# Patient Record
Sex: Female | Born: 1975 | Race: Black or African American | Hispanic: No | Marital: Married | Smoking: Never smoker
Health system: Southern US, Community
[De-identification: ages and names within clinical notes are randomized; demographics above are authoritative.]

## PROBLEM LIST (undated history)

## (undated) DIAGNOSIS — Z9889 Other specified postprocedural states: Secondary | ICD-10-CM

## (undated) DIAGNOSIS — F32A Depression, unspecified: Secondary | ICD-10-CM

## (undated) DIAGNOSIS — R112 Nausea with vomiting, unspecified: Secondary | ICD-10-CM

## (undated) DIAGNOSIS — F329 Major depressive disorder, single episode, unspecified: Secondary | ICD-10-CM

## (undated) HISTORY — PX: LEEP: SHX91

## (undated) SURGERY — Surgical Case
Anesthesia: *Unknown

---

## 2009-05-15 ENCOUNTER — Inpatient Hospital Stay (HOSPITAL_COMMUNITY): Admission: AD | Admit: 2009-05-15 | Discharge: 2009-05-19 | Payer: Self-pay | Admitting: Obstetrics and Gynecology

## 2009-05-16 ENCOUNTER — Encounter (INDEPENDENT_AMBULATORY_CARE_PROVIDER_SITE_OTHER): Payer: Self-pay | Admitting: Obstetrics and Gynecology

## 2009-06-12 ENCOUNTER — Ambulatory Visit: Admission: RE | Admit: 2009-06-12 | Discharge: 2009-06-12 | Payer: Self-pay | Admitting: Obstetrics and Gynecology

## 2010-06-28 LAB — CBC
HCT: 37.5 % (ref 36.0–46.0)
MCHC: 33.4 g/dL (ref 30.0–36.0)
MCHC: 33.9 g/dL (ref 30.0–36.0)
MCV: 99.2 fL (ref 78.0–100.0)
Platelets: 159 10*3/uL (ref 150–400)
Platelets: 189 10*3/uL (ref 150–400)
RDW: 13.5 % (ref 11.5–15.5)
RDW: 13.7 % (ref 11.5–15.5)

## 2010-12-24 LAB — HIV ANTIBODY (ROUTINE TESTING W REFLEX): HIV: NONREACTIVE

## 2010-12-24 LAB — RUBELLA ANTIBODY, IGM: Rubella: IMMUNE

## 2010-12-24 LAB — GC/CHLAMYDIA PROBE AMP, GENITAL
Chlamydia: NEGATIVE
Gonorrhea: NEGATIVE

## 2010-12-24 LAB — ABO/RH

## 2011-04-26 ENCOUNTER — Other Ambulatory Visit: Payer: Self-pay | Admitting: Obstetrics and Gynecology

## 2011-05-07 ENCOUNTER — Other Ambulatory Visit: Payer: Self-pay | Admitting: Obstetrics and Gynecology

## 2011-06-17 ENCOUNTER — Encounter (HOSPITAL_COMMUNITY): Payer: Self-pay

## 2011-07-01 ENCOUNTER — Encounter (HOSPITAL_COMMUNITY)
Admission: RE | Admit: 2011-07-01 | Discharge: 2011-07-01 | Disposition: A | Discharge status: Home / Self Care | Payer: BC Managed Care – PPO | Source: Ambulatory Visit | Attending: Obstetrics and Gynecology | Admitting: Obstetrics and Gynecology

## 2011-07-01 ENCOUNTER — Encounter (HOSPITAL_COMMUNITY): Payer: Self-pay

## 2011-07-01 HISTORY — DX: Nausea with vomiting, unspecified: R11.2

## 2011-07-01 HISTORY — DX: Nausea with vomiting, unspecified: Z98.890

## 2011-07-01 LAB — URINALYSIS, ROUTINE W REFLEX MICROSCOPIC
Bilirubin Urine: NEGATIVE
Glucose, UA: NEGATIVE mg/dL
Hgb urine dipstick: NEGATIVE
Ketones, ur: NEGATIVE mg/dL
Nitrite: NEGATIVE
Protein, ur: NEGATIVE mg/dL
Specific Gravity, Urine: >1.03 — ABNORMAL HIGH (ref 1.005–1.030)
Urobilinogen, UA: 0.2 mg/dL (ref 0.0–1.0)
pH: 6 (ref 5.0–8.0)

## 2011-07-01 LAB — COMPREHENSIVE METABOLIC PANEL
ALT: 9 U/L (ref 0–35)
AST: 16 U/L (ref 0–37)
Alkaline Phosphatase: 92 U/L (ref 39–117)
CO2: 24 mEq/L (ref 19–32)
Calcium: 9.1 mg/dL (ref 8.4–10.5)
Chloride: 102 mEq/L (ref 96–112)
GFR calc Af Amer: >90 mL/min (ref >90)
GFR calc non Af Amer: >90 mL/min (ref >90)
Glucose, Bld: 67 mg/dL — ABNORMAL LOW (ref 70–99)
Potassium: 3.7 mEq/L (ref 3.5–5.1)
Sodium: 134 mEq/L — ABNORMAL LOW (ref 135–145)
Total Bilirubin: 0.6 mg/dL (ref 0.3–1.2)

## 2011-07-01 LAB — CBC
MCH: 31.6 pg (ref 26.0–34.0)
Platelets: 241 10*3/uL (ref 150–400)
RBC: 3.76 MIL/uL — ABNORMAL LOW (ref 3.87–5.11)
WBC: 10.3 10*3/uL (ref 4.0–10.5)

## 2011-07-01 LAB — DIFFERENTIAL
Eosinophils Absolute: 0.1 10*3/uL (ref 0.0–0.7)
Lymphocytes Relative: 16 % (ref 12–46)
Lymphs Abs: 1.7 10*3/uL (ref 0.7–4.0)
Neutrophils Relative %: 74 % (ref 43–77)

## 2011-07-01 LAB — URINE MICROSCOPIC-ADD ON

## 2011-07-01 NOTE — Pre-Procedure Instructions (Signed)
Dr. Sherron Ales notified of pt's low glucose result from PAT- result was 67. I spoke with pt who said she had a bagel for breakfast; she denied any symptoms of hypoglycemia. Dr Jean Rosenthal ordered a FBS for DOS.

## 2011-07-01 NOTE — Patient Instructions (Signed)
YOUR PROCEDURE IS SCHEDULED ON: 07/08/11  ENTER THROUGH THE MAIN ENTRANCE OF [facility] AT:0600 am  USE DESK PHONE AND DIAL [id number] TO INFORM us OF YOUR ARRIVAL  CALL [phone] IF YOU HAVE ANY QUESTIONS OR PROBLEMS PRIOR TO YOUR ARRIVAL.  REMEMBER: DO NOT EAT OR DRINK AFTER MIDNIGHT : Sunday  SPECIAL INSTRUCTIONS:   YOU MAY BRUSH YOUR TEETH THE MORNING OF SURGERY   TAKE THESE MEDICINES THE DAY OF SURGERY WITH SIP OF WATER:none   DO NOT WEAR JEWELRY, EYE MAKEUP, LIPSTICK OR DARK FINGERNAIL POLISH DO NOT WEAR LOTIONS  DO NOT SHAVE FOR 48 HOURS PRIOR TO SURGERY  YOU WILL NOT BE ALLOWED TO DRIVE YOURSELF HOME.  NAME OF DRIVER:Eric

## 2011-07-07 ENCOUNTER — Other Ambulatory Visit: Payer: Self-pay | Admitting: Obstetrics and Gynecology

## 2011-07-07 MED ORDER — CEFAZOLIN SODIUM-DEXTROSE 2-3 GM-% IV SOLR
2.0000 g | INTRAVENOUS | Status: AC
  Administered 2011-07-08: 2 g via INTRAVENOUS
  Filled 2011-07-07: qty 50

## 2011-07-08 ENCOUNTER — Encounter (HOSPITAL_COMMUNITY): Payer: Self-pay | Admitting: Anesthesiology

## 2011-07-08 ENCOUNTER — Inpatient Hospital Stay (HOSPITAL_COMMUNITY)
Admission: RE | Admit: 2011-07-08 | Discharge: 2011-07-11 | DRG: 371 | Disposition: A | Discharge status: Home / Self Care | Payer: BC Managed Care – PPO | Source: Ambulatory Visit | Attending: Obstetrics and Gynecology | Admitting: Obstetrics and Gynecology

## 2011-07-08 ENCOUNTER — Encounter (HOSPITAL_COMMUNITY): Payer: Self-pay | Admitting: *Deleted

## 2011-07-08 ENCOUNTER — Inpatient Hospital Stay (HOSPITAL_COMMUNITY): Payer: BC Managed Care – PPO | Admitting: Anesthesiology

## 2011-07-08 ENCOUNTER — Encounter (HOSPITAL_COMMUNITY)
Admission: RE | Disposition: A | Discharge status: Home / Self Care | Payer: Self-pay | Source: Ambulatory Visit | Attending: Obstetrics and Gynecology

## 2011-07-08 DIAGNOSIS — O34219 Maternal care for unspecified type scar from previous cesarean delivery: Principal | ICD-10-CM | POA: Diagnosis present

## 2011-07-08 DIAGNOSIS — Z98891 History of uterine scar from previous surgery: Secondary | ICD-10-CM

## 2011-07-08 SURGERY — Surgical Case
Anesthesia: Spinal | Site: Abdomen | Wound class: Clean Contaminated

## 2011-07-08 MED ORDER — LACTATED RINGERS IV SOLN
INTRAVENOUS | Status: DC
  Administered 2011-07-08: 14:00:00 via INTRAVENOUS

## 2011-07-08 MED ORDER — MENTHOL 3 MG MT LOZG: 1.0000 | LOZENGE | OROMUCOSAL | Status: DC | PRN

## 2011-07-08 MED ORDER — KETOROLAC TROMETHAMINE 30 MG/ML IJ SOLN: 15.0000 mg | Freq: Once | INTRAMUSCULAR | Status: DC | PRN

## 2011-07-08 MED ORDER — IBUPROFEN 600 MG PO TABS
600.0000 mg | ORAL_TABLET | Freq: Four times a day (QID) | ORAL | Status: DC | PRN
  Filled 2011-07-08 (×4): qty 1

## 2011-07-08 MED ORDER — SCOPOLAMINE 1 MG/3DAYS TD PT72
1.0000 | MEDICATED_PATCH | Freq: Once | TRANSDERMAL | Status: AC
  Administered 2011-07-08: 1.5 mg via TRANSDERMAL

## 2011-07-08 MED ORDER — FENTANYL CITRATE 0.05 MG/ML IJ SOLN
INTRAMUSCULAR | Status: AC
  Filled 2011-07-08: qty 2

## 2011-07-08 MED ORDER — WITCH HAZEL-GLYCERIN EX PADS: 1.0000 "application " | MEDICATED_PAD | CUTANEOUS | Status: DC | PRN

## 2011-07-08 MED ORDER — LANOLIN HYDROUS EX OINT: 1.0000 "application " | TOPICAL_OINTMENT | CUTANEOUS | Status: DC | PRN

## 2011-07-08 MED ORDER — CEFAZOLIN SODIUM 1-5 GM-% IV SOLN
1.0000 g | Freq: Three times a day (TID) | INTRAVENOUS | Status: AC
  Administered 2011-07-08 (×2): 1 g via INTRAVENOUS
  Filled 2011-07-08 (×2): qty 50

## 2011-07-08 MED ORDER — PHENYLEPHRINE HCL 10 MG/ML IJ SOLN
INTRAMUSCULAR | Status: DC | PRN
  Administered 2011-07-08: 80 ug via INTRAVENOUS
  Administered 2011-07-08 (×5): 40 ug via INTRAVENOUS
  Administered 2011-07-08: 80 ug via INTRAVENOUS

## 2011-07-08 MED ORDER — DIPHENHYDRAMINE HCL 50 MG/ML IJ SOLN: 12.5000 mg | INTRAMUSCULAR | Status: DC | PRN

## 2011-07-08 MED ORDER — PHENYLEPHRINE 40 MCG/ML (10ML) SYRINGE FOR IV PUSH (FOR BLOOD PRESSURE SUPPORT)
PREFILLED_SYRINGE | INTRAVENOUS | Status: AC
  Filled 2011-07-08: qty 10

## 2011-07-08 MED ORDER — HYDROMORPHONE HCL PF 1 MG/ML IJ SOLN
0.2500 mg | INTRAMUSCULAR | Status: DC | PRN
Start: 2011-07-08 — End: 2011-07-08

## 2011-07-08 MED ORDER — DIBUCAINE 1 % RE OINT: 1.0000 "application " | TOPICAL_OINTMENT | RECTAL | Status: DC | PRN

## 2011-07-08 MED ORDER — TETANUS-DIPHTH-ACELL PERTUSSIS 5-2.5-18.5 LF-MCG/0.5 IM SUSP: 0.5000 mL | Freq: Once | INTRAMUSCULAR | Status: DC

## 2011-07-08 MED ORDER — PROMETHAZINE HCL 25 MG/ML IJ SOLN: 6.2500 mg | INTRAMUSCULAR | Status: DC | PRN

## 2011-07-08 MED ORDER — DIPHENHYDRAMINE HCL 25 MG PO CAPS
25.0000 mg | ORAL_CAPSULE | ORAL | Status: DC | PRN
  Administered 2011-07-08: 25 mg via ORAL
  Filled 2011-07-08: qty 1

## 2011-07-08 MED ORDER — OXYTOCIN 10 UNIT/ML IJ SOLN
INTRAMUSCULAR | Status: AC
  Filled 2011-07-08: qty 2

## 2011-07-08 MED ORDER — OXYTOCIN 20 UNITS IN LACTATED RINGERS INFUSION - SIMPLE: 125.0000 mL/h | INTRAVENOUS | Status: AC

## 2011-07-08 MED ORDER — NALOXONE HCL 0.4 MG/ML IJ SOLN: 0.4000 mg | INTRAMUSCULAR | Status: DC | PRN

## 2011-07-08 MED ORDER — NALBUPHINE HCL 10 MG/ML IJ SOLN
5.0000 mg | INTRAMUSCULAR | Status: DC | PRN
  Filled 2011-07-08: qty 1

## 2011-07-08 MED ORDER — KETOROLAC TROMETHAMINE 30 MG/ML IJ SOLN
30.0000 mg | Freq: Four times a day (QID) | INTRAMUSCULAR | Status: AC | PRN
  Administered 2011-07-08: 30 mg via INTRAVENOUS
  Filled 2011-07-08: qty 1

## 2011-07-08 MED ORDER — ONDANSETRON HCL 4 MG PO TABS: 4.0000 mg | ORAL_TABLET | ORAL | Status: DC | PRN

## 2011-07-08 MED ORDER — MEPERIDINE HCL 25 MG/ML IJ SOLN: 6.2500 mg | INTRAMUSCULAR | Status: DC | PRN

## 2011-07-08 MED ORDER — SODIUM CHLORIDE 0.9 % IJ SOLN: 3.0000 mL | INTRAMUSCULAR | Status: DC | PRN

## 2011-07-08 MED ORDER — PRENATAL MULTIVITAMIN CH
1.0000 | ORAL_TABLET | Freq: Every day | ORAL | Status: DC
  Administered 2011-07-09 – 2011-07-11 (×3): 1 via ORAL
  Filled 2011-07-08 (×3): qty 1

## 2011-07-08 MED ORDER — OXYCODONE-ACETAMINOPHEN 5-325 MG PO TABS
1.0000 | ORAL_TABLET | ORAL | Status: DC | PRN
  Administered 2011-07-09: 2 via ORAL
  Administered 2011-07-09 – 2011-07-10 (×3): 1 via ORAL
  Administered 2011-07-10: 2 via ORAL
  Administered 2011-07-10 – 2011-07-11 (×2): 1 via ORAL
  Administered 2011-07-11: 2 via ORAL
  Filled 2011-07-08 (×3): qty 1
  Filled 2011-07-08: qty 2
  Filled 2011-07-08: qty 1
  Filled 2011-07-08: qty 2
  Filled 2011-07-08: qty 1
  Filled 2011-07-08: qty 2

## 2011-07-08 MED ORDER — SODIUM CHLORIDE 0.9 % IV SOLN
1.0000 ug/kg/h | INTRAVENOUS | Status: DC | PRN
  Filled 2011-07-08: qty 2.5

## 2011-07-08 MED ORDER — SIMETHICONE 80 MG PO CHEW
80.0000 mg | CHEWABLE_TABLET | Freq: Three times a day (TID) | ORAL | Status: DC
  Administered 2011-07-08 – 2011-07-11 (×11): 80 mg via ORAL

## 2011-07-08 MED ORDER — ONDANSETRON HCL 4 MG/2ML IJ SOLN
INTRAMUSCULAR | Status: DC | PRN
  Administered 2011-07-08: 4 mg via INTRAVENOUS

## 2011-07-08 MED ORDER — DIPHENHYDRAMINE HCL 50 MG/ML IJ SOLN: 25.0000 mg | INTRAMUSCULAR | Status: DC | PRN

## 2011-07-08 MED ORDER — SCOPOLAMINE 1 MG/3DAYS TD PT72
MEDICATED_PATCH | TRANSDERMAL | Status: AC
  Administered 2011-07-08: 1.5 mg via TRANSDERMAL
  Filled 2011-07-08: qty 1

## 2011-07-08 MED ORDER — KETOROLAC TROMETHAMINE 60 MG/2ML IM SOLN
60.0000 mg | Freq: Once | INTRAMUSCULAR | Status: AC | PRN
  Filled 2011-07-08: qty 2

## 2011-07-08 MED ORDER — DIPHENHYDRAMINE HCL 25 MG PO CAPS: 25.0000 mg | ORAL_CAPSULE | Freq: Four times a day (QID) | ORAL | Status: DC | PRN

## 2011-07-08 MED ORDER — SIMETHICONE 80 MG PO CHEW: 80.0000 mg | CHEWABLE_TABLET | ORAL | Status: DC | PRN

## 2011-07-08 MED ORDER — ONDANSETRON HCL 4 MG/2ML IJ SOLN: 4.0000 mg | INTRAMUSCULAR | Status: DC | PRN

## 2011-07-08 MED ORDER — CEFAZOLIN SODIUM-DEXTROSE 2-3 GM-% IV SOLR: 2.0000 g | Freq: Once | INTRAVENOUS | Status: DC

## 2011-07-08 MED ORDER — ONDANSETRON HCL 4 MG/2ML IJ SOLN
INTRAMUSCULAR | Status: AC
  Filled 2011-07-08: qty 2

## 2011-07-08 MED ORDER — ONDANSETRON HCL 4 MG/2ML IJ SOLN: 4.0000 mg | Freq: Three times a day (TID) | INTRAMUSCULAR | Status: DC | PRN

## 2011-07-08 MED ORDER — MORPHINE SULFATE 0.5 MG/ML IJ SOLN
INTRAMUSCULAR | Status: AC
  Filled 2011-07-08: qty 10

## 2011-07-08 MED ORDER — KETOROLAC TROMETHAMINE 30 MG/ML IJ SOLN: 30.0000 mg | Freq: Four times a day (QID) | INTRAMUSCULAR | Status: AC | PRN

## 2011-07-08 MED ORDER — ZOLPIDEM TARTRATE 5 MG PO TABS: 5.0000 mg | ORAL_TABLET | Freq: Every evening | ORAL | Status: DC | PRN

## 2011-07-08 MED ORDER — LACTATED RINGERS IV SOLN
INTRAVENOUS | Status: DC
  Administered 2011-07-08 (×2): via INTRAVENOUS
  Administered 2011-07-08 (×2): 125 mL/h via INTRAVENOUS

## 2011-07-08 MED ORDER — SENNOSIDES-DOCUSATE SODIUM 8.6-50 MG PO TABS
2.0000 | ORAL_TABLET | Freq: Every day | ORAL | Status: DC
  Administered 2011-07-08 – 2011-07-09 (×2): 2 via ORAL

## 2011-07-08 MED ORDER — FENTANYL CITRATE 0.05 MG/ML IJ SOLN
INTRAMUSCULAR | Status: DC | PRN
  Administered 2011-07-08: 25 ug via INTRATHECAL

## 2011-07-08 MED ORDER — MORPHINE SULFATE (PF) 0.5 MG/ML IJ SOLN
INTRAMUSCULAR | Status: DC | PRN
  Administered 2011-07-08: .15 mg via INTRATHECAL

## 2011-07-08 MED ORDER — OXYTOCIN 10 UNIT/ML IJ SOLN
INTRAMUSCULAR | Status: DC | PRN
  Administered 2011-07-08: 20 [IU] via INTRAMUSCULAR

## 2011-07-08 MED ORDER — IBUPROFEN 600 MG PO TABS
600.0000 mg | ORAL_TABLET | Freq: Four times a day (QID) | ORAL | Status: DC
  Administered 2011-07-08 – 2011-07-11 (×10): 600 mg via ORAL
  Filled 2011-07-08 (×6): qty 1

## 2011-07-08 MED ORDER — MEASLES, MUMPS & RUBELLA VAC ~~LOC~~ INJ
0.5000 mL | INJECTION | Freq: Once | SUBCUTANEOUS | Status: DC
  Filled 2011-07-08: qty 0.5

## 2011-07-08 SURGICAL SUPPLY — 35 items
BENZOIN TINCTURE PRP APPL 2/3 (GAUZE/BANDAGES/DRESSINGS) ×2 IMPLANT
CLOTH BEACON ORANGE TIMEOUT ST (SAFETY) ×2 IMPLANT
CONTAINER PREFILL 10% NBF 15ML (MISCELLANEOUS) IMPLANT
DRESSING TELFA 8X3 (GAUZE/BANDAGES/DRESSINGS) IMPLANT
DRSG VASELINE 3X18 (GAUZE/BANDAGES/DRESSINGS) ×2 IMPLANT
ELECT REM PT RETURN 9FT ADLT (ELECTROSURGICAL) ×2
ELECTRODE REM PT RTRN 9FT ADLT (ELECTROSURGICAL) ×1 IMPLANT
EXTRACTOR VACUUM KIWI (MISCELLANEOUS) IMPLANT
EXTRACTOR VACUUM M CUP 4 TUBE (SUCTIONS) IMPLANT
GAUZE SPONGE 4X4 12PLY STRL LF (GAUZE/BANDAGES/DRESSINGS) ×4 IMPLANT
GAUZE VASELINE 3X9 (GAUZE/BANDAGES/DRESSINGS) ×2 IMPLANT
GLOVE BIO SURGEON STRL SZ7.5 (GLOVE) ×6 IMPLANT
GOWN PREVENTION PLUS LG XLONG (DISPOSABLE) ×4 IMPLANT
GOWN PREVENTION PLUS XLARGE (GOWN DISPOSABLE) ×2 IMPLANT
KIT ABG SYR 3ML LUER SLIP (SYRINGE) IMPLANT
NEEDLE HYPO 25X5/8 SAFETYGLIDE (NEEDLE) IMPLANT
NS IRRIG 1000ML POUR BTL (IV SOLUTION) ×2 IMPLANT
PACK C SECTION WH (CUSTOM PROCEDURE TRAY) ×2 IMPLANT
PAD ABD 7.5X8 STRL (GAUZE/BANDAGES/DRESSINGS) IMPLANT
RTRCTR C-SECT PINK 25CM LRG (MISCELLANEOUS) ×2 IMPLANT
SLEEVE SCD COMPRESS KNEE MED (MISCELLANEOUS) IMPLANT
SPONGE GAUZE 4X4 12PLY (GAUZE/BANDAGES/DRESSINGS) ×2 IMPLANT
STAPLER VISISTAT 35W (STAPLE) IMPLANT
STRIP CLOSURE SKIN 1/2X4 (GAUZE/BANDAGES/DRESSINGS) ×2 IMPLANT
SUT PLAIN 0 NONE (SUTURE) IMPLANT
SUT VIC AB 0 CT1 36 (SUTURE) ×16 IMPLANT
SUT VIC AB 3-0 CTX 36 (SUTURE) ×2 IMPLANT
SUT VIC AB 3-0 SH 27 (SUTURE) ×2
SUT VIC AB 3-0 SH 27X BRD (SUTURE) ×2 IMPLANT
SUT VIC AB 4-0 KS 27 (SUTURE) ×2 IMPLANT
SUT VICRYL 0 TIES 12 18 (SUTURE) IMPLANT
TAPE CLOTH SURG 4X10 WHT LF (GAUZE/BANDAGES/DRESSINGS) ×2 IMPLANT
TOWEL OR 17X24 6PK STRL BLUE (TOWEL DISPOSABLE) ×4 IMPLANT
TRAY FOLEY CATH 14FR (SET/KITS/TRAYS/PACK) ×2 IMPLANT
WATER STERILE IRR 1000ML POUR (IV SOLUTION) IMPLANT

## 2011-07-08 NOTE — Anesthesia Postprocedure Evaluation (Signed)
  Anesthesia Post-op Note  Patient: Abigail Gilbert  Procedure(s) Performed: Procedure(s) (LRB): CESAREAN SECTION (N/A)  Patient Location: PACU and Mother/Baby  Anesthesia Type: Spinal  Level of Consciousness: awake, alert  and oriented  Airway and Oxygen Therapy: Patient Spontanous Breathing  Post-op Pain: mild  Post-op Assessment: Patient's Cardiovascular Status Stable, Respiratory Function Stable, No signs of Nausea or vomiting and Pain level controlled  Post-op Vital Signs: stable  Complications: No apparent anesthesia complications

## 2011-07-08 NOTE — Anesthesia Procedure Notes (Signed)

## 2011-07-08 NOTE — H&P (Signed)
NAMEMALLORIE, NORROD              ACCOUNT NO.:  [id number]  MEDICAL RECORD NO.:  [id number]  LOCATION:                                 FACILITY:  PHYSICIAN:  Malachi Pro. Ambrose Mantle, M.D. DATE OF BIRTH:  [birth year]  DATE OF ADMISSION:  07/08/2011 DATE OF DISCHARGE:                             HISTORY & PHYSICAL   PRESENT ILLNESS:  A 36 year old black female, para 1-0-0-1, gravida 2, EDC July 15, 2011, by an ultrasound done at 9 weeks and 4 days on December 14, 2010.  Blood group and type O positive, negative antibody, negative Pap smear.  Rubella immune.  RPR nonreactive.  Urine culture positive for E coli treated with Macrobid.  Hepatitis B surface antigen negative, HIV negative, hemoglobin electrophoresis AA.  GC and Chlamydia negative.  First trimester screen normal.  CF screen negative, AFP negative.  One hour Glucola 96, group B strep negative.  The patient had an ultrasound on December 14, 2010, 9 weeks, 4 days, [facility] July 15, 2011. She had an uncomplicated prenatal course, and is admitted now for repeat C-section after declining vaginal birth after cesarean.  PAST MEDICAL HISTORY:  She has had a history of abnormal Pap smear with a LEEP procedure.  Had a breast cyst that resolved.  History of mild depression and eczema.  SURGERIES:  Laser of the cervix, wisdom teeth extraction, hemorrhoid surgery, and cesarean section.  ALLERGIES:  Carrots cause hives and oral swelling, Zithromax causes hives.  FAMILY HISTORY:  Brother has heart disease.  Maternal grandfather and paternal grandmother cancer of the lung.  The patient has an associate's degree and also a Barista.  She denies alcohol, tobacco, and illicit substance abuse.  She lives with her husband and child.  She is a IT consultant.  She delivered May 16, 2009, a 6 pounds 15 ounce female infant by C-section for fetal distress.  PHYSICAL EXAMINATION:  VITAL SIGNS:  On admission, blood pressure 120/80, pulse 80. HEAD,  EYES, EARS, NOSE, AND THROAT:  Normal. LUNGS:  Clear to auscultation. HEART:  Normal size and sounds.  No murmurs. ABDOMEN:  Soft, fundal height on July 04, 2011, was 38 cm.  Fetal heart tone is normal.  Cervix was 2 cm, 60%, vertex, at a -3 station.  ADMITTING IMPRESSION:  Intrauterine pregnancy, 39 weeks, prior cesarean section declined  vaginal birth after cesarean.  The patient is prepared for cesarean section.    Malachi Pro. Ambrose Mantle, M.D.    TFH/MEDQ  D:  07/07/2011  T:  07/07/2011  Job:  [id number]

## 2011-07-08 NOTE — H&P (Signed)
Abigail Gilbert is a 36 y.o. female presenting for c section. History OB History    Grav Para Term Preterm Abortions TAB SAB Ect Mult Living   2 1        1      Past Medical History  Diagnosis Date  . DEMENTIA     h/o situational depression- no mends now  . PONV (postoperative nausea and vomiting)     nausea after 1st CS   Past Surgical History  Procedure Date  . Cesarean section   . Leep    Family History: family history is not on file. Social History:  does not have a smoking history on file. She does not have any smokeless tobacco history on file. Her alcohol and drug histories not on file.  ROS negative   Blood pressure 110/85, pulse 95, temperature 98.1 F (36.7 C), temperature source Oral, resp. rate 18, last menstrual period 10/12/2010, SpO2 97.00%. Exam Physical Exam HENT normal Heart and lungs normal FH 38 cm FHT's normal Cervix 2 cm Prenatal labs: ABO, Rh: O/Positive/-- (09/17 0000) Antibody: Negative (09/17 0000) Rubella: Immune (09/17 0000) RPR: NON REACTIVE (03/25 1013)  HBsAg: Negative (09/17 0000)  HIV: Non-reactive (09/17 0000)  GBS:     Assessment/Plan: IUP 39 weeks Prior C section Will do repeat Csection  Jayse Hodkinson F 07/08/2011, 7:28 AM

## 2011-07-08 NOTE — Transfer of Care (Signed)
Immediate Anesthesia Transfer of Care Note  Patient: Abigail Gilbert  Procedure(s) Performed: Procedure(s) (LRB): CESAREAN SECTION (N/A)  Patient Location: PACU  Anesthesia Type: Spinal  Level of Consciousness: awake, alert  and oriented  Airway & Oxygen Therapy: Patient Spontanous Breathing  Post-op Assessment: Report given to PACU RN and Post -op Vital signs reviewed and stable  Post vital signs: stable  Complications: No apparent anesthesia complications

## 2011-07-08 NOTE — Addendum Note (Signed)
Addendum  created 07/08/11 1626 by Elbert Ewings, CRNA   Modules edited:Notes Section

## 2011-07-08 NOTE — Op Note (Signed)
Operative note on Abigail Gilbert:  Date of the operation for 07-08-11  Preoperative diagnosis: Intrauterine pregnancy 39 weeks prior C. Section declined VBAC  Postoperative diagnosis: Same  Operation: Low transverse cervical C-section  Operator: Lorena Benham assistant: Bovard  Anesthesia: Spinal Dr. Cristela Blue  The patient was brought to the operating room, placed on the operating table, and given a spinal anesthetic by Dr. Cristela Blue. She was placed in left lateral tilt position in frog leg. The abdomen and urethra were prepped with Betadine solution, a Foley catheter was inserted to straight drain, an exam revealed the vertex to be presenting. She was taken out of frog leg position and the abdomen was draped as a sterile field. Anesthesia was confirmed by pinching the lower abdomen with an Allis clamp and the old incision was utilized to make an incision through the skin subcutaneous tissue and fascia. The fascia was separated from the rectus muscle superiorly and inferiorly. The peritoneum was opened and the incision was enlarged. The ALEXIS retractor was used to visualize the lower uterine segment. A short transverse incision was made in the lower uterine segment through the superficial layers of the myometrium. I went into the amniotic sac with my finger and a lot of clear fluid was released. The vertex was elevated into the incisional opening and the vertex was delivered by fundal pressure. The nose and pharynx were suctioned with a bulb and the remainder of the baby was delivered. The infant was female and after the cord was cut the infant was given to Dr. Dorene Grebe who was in attendance. There was no scale in the operating room so the weight is unknown.Apgars were 8 and 8 at one and 5 minutes. The placenta was removed, the inside of the uterus had no remaining products of conception,the tubes and ovaries and uterus were normal and the uterine incision was closed with 2 running sutures of 0 Vicryl  locking the first layer nonlocking on the second layer.Hemostasis was achieved with several figure-of-eight sutures of 3-0 Vicryl.The gutters were blotted free of blood, liberal irrigation confirmed hemostasis, and the abdominal wall was closed in layers using interrupted sutures of 0 Vicryl to close the rectus muscle and peritoneum in one layer, 2 running sutures of 0 Vicryl on the fascia, a running 3-0 Vicryl on the subcutaneous tissue, and a 4 0 Keith needle on the skin.Sponge and needle counts were correct and the patient was returned to recovery in satisfactory condition.

## 2011-07-08 NOTE — Anesthesia Preprocedure Evaluation (Signed)
Anesthesia Evaluation  Patient identified by MRN, date of birth, ID band Patient awake    Reviewed: Allergy & Precautions, H&P , NPO status , Patient's Chart, lab work & pertinent test results  History of Anesthesia Complications (+) PONV  Airway Mallampati: I TM Distance: >3 FB Neck ROM: full    Dental No notable dental hx.    Pulmonary neg pulmonary ROS,    Pulmonary exam normal       Cardiovascular negative cardio ROS      Neuro/Psych negative neurological ROS     GI/Hepatic negative GI ROS, Neg liver ROS,   Endo/Other  negative endocrine ROS  Renal/GU negative Renal ROS  negative genitourinary   Musculoskeletal negative musculoskeletal ROS (+)   Abdominal Normal abdominal exam  (+)   Peds negative pediatric ROS (+)  Hematology negative hematology ROS (+)   Anesthesia Other Findings   Reproductive/Obstetrics (+) Pregnancy                           Anesthesia Physical Anesthesia Plan  ASA: II  Anesthesia Plan: Spinal   Post-op Pain Management:    Induction:   Airway Management Planned:   Additional Equipment:   Intra-op Plan:   Post-operative Plan:   Informed Consent: I have reviewed the patients History and Physical, chart, labs and discussed the procedure including the risks, benefits and alternatives for the proposed anesthesia with the patient or authorized representative who has indicated his/her understanding and acceptance.     Plan Discussed with: CRNA and Surgeon  Anesthesia Plan Comments:         Anesthesia Quick Evaluation

## 2011-07-08 NOTE — Anesthesia Postprocedure Evaluation (Signed)
Anesthesia Post Note  Patient: Abigail Gilbert  Procedure(s) Performed: Procedure(s) (LRB): CESAREAN SECTION (N/A)  Anesthesia type: Spinal  Patient location: PACU  Post pain: Pain level controlled  Post assessment: Post-op Vital signs reviewed  Last Vitals:  Filed Vitals:   07/08/11 0945  BP: 125/70  Pulse: 75  Temp:   Resp: 16    Post vital signs: Reviewed  Level of consciousness: awake  Complications: No apparent anesthesia complications

## 2011-07-08 NOTE — Progress Notes (Signed)
Patient ID: Abigail Gilbert, female   DOB: 1975-12-01, 36 y.o.   MRN: [id number] I examined this lady on 07-05-11 and she reports no change in her health since that time.

## 2011-07-09 ENCOUNTER — Encounter (HOSPITAL_COMMUNITY): Payer: Self-pay | Admitting: Obstetrics and Gynecology

## 2011-07-09 LAB — CBC
HCT: 33.5 % — ABNORMAL LOW (ref 36.0–46.0)
MCH: 31.4 pg (ref 26.0–34.0)
MCV: 94.9 fL (ref 78.0–100.0)
Platelets: 222 10*3/uL (ref 150–400)
RDW: 14 % (ref 11.5–15.5)
WBC: 16.1 10*3/uL — ABNORMAL HIGH (ref 4.0–10.5)

## 2011-07-09 NOTE — Progress Notes (Signed)
Patient ID: Abigail Gilbert, female   DOB: [birth year], 36 y.o.   MRN: [id number] afebrile BP normal HGB 11.1 Doing well

## 2011-07-10 NOTE — Progress Notes (Signed)

## 2011-07-10 NOTE — Progress Notes (Signed)
Patient ID: Abigail Gilbert, female   DOB: 1976/03/24, 36 y.o.   MRN: [id number] #[id number] afebrile BP normal. She is tolerating a regular diet, passing flatus, voiding well and ambulating well.

## 2011-07-11 MED ORDER — OXYCODONE-ACETAMINOPHEN 5-325 MG PO TABS: 1.0000 | ORAL_TABLET | Freq: Four times a day (QID) | ORAL | Status: AC | PRN

## 2011-07-11 MED ORDER — IBUPROFEN 600 MG PO TABS: 600.0000 mg | ORAL_TABLET | Freq: Four times a day (QID) | ORAL | Status: AC | PRN

## 2011-07-11 NOTE — Discharge Instructions (Signed)
booklet °

## 2011-07-11 NOTE — Discharge Summary (Signed)
Abigail Gilbert, Abigail Gilbert              ACCOUNT NO.:  [id number]  MEDICAL RECORD NO.:  [id number]  LOCATION:  9127                          FACILITY:  [facility]  PHYSICIAN:  Malachi Pro. Ambrose Mantle, M.D. DATE OF BIRTH:  Jul 26, 1975  DATE OF ADMISSION:  07/08/2011 DATE OF DISCHARGE:  07/11/2011                              DISCHARGE SUMMARY   A 36 year old black female, para 1-0-0-1, gravida 2, EDC July 15, 2011 by an ultrasound done at 9 weeks and 4 days, was admitted for repeat C- section after declining VBAC and she underwent a low-transverse cervical C-section on July 08, 2011.  Blood group and type O positive, negative antibody, negative Pap smear, rubella immune.  RPR nonreactive.  Urine culture positive for E. coli, treated with Macrobid.  Hepatitis B surface antigen negative.  HIV negative.  Hemoglobin electrophoresis AA. GC and Chlamydia negative.  First trimester screen normal.  CF screen negative.  AFP negative.  1 hour Glucola 96 and group B strep negative. The patient was given a spinal anesthetic by Dr. Cristela Blue.  Dr. Ambrose Mantle performed a low-transverse cervical C-section with Dr. Emeline Darling assistance.  The infant was attended to by Dr. Dorene Grebe.  Apgars were 8 and 8 at 1 and 5 minutes.  The infant was female.  Her uterus, both tubes, and ovaries were normal.  Postoperatively, the patient did quite well.  Initial hemoglobin was 11.9, hematocrit 35.2, white count 10,300, platelet count 241,000.  Comprehensive metabolic profile was normal. RPR was nonreactive.  Followup hemoglobin 11.1, hematocrit 33.5, white count 16,100.  FINAL DIAGNOSIS:  Intrauterine pregnancy at 39 weeks, delivered by repeat C-section after declining VBAC.  OPERATION:  Low-transverse cervical C-section.  FINAL CONDITION:  Improved.  INSTRUCTIONS:  Include our regular discharge instruction booklet.  She is given a copy of our after visit summary, and she is given a prescriptions for Percocet 5/325, 30 tablets, 1  every 6 hours as needed for pain and Motrin 600 mg, 30 tablets, 1 every 6 hours as needed for pain.  She is asked to return to the office in 2 weeks for followup examination and continue her prenatal vitamins.     Malachi Pro. Ambrose Mantle, M.D.     TFH/MEDQ  D:  07/11/2011  T:  07/11/2011  Job:  [id number]

## 2011-07-11 NOTE — Progress Notes (Signed)
Patient ID: Abigail Gilbert, female   DOB: 04/14/1975, 36 y.o.   MRN: [id number] #[id number] afebrile for d/c

## 2013-12-06 ENCOUNTER — Encounter (HOSPITAL_COMMUNITY): Payer: Self-pay | Admitting: *Deleted

## 2013-12-06 ENCOUNTER — Inpatient Hospital Stay (HOSPITAL_COMMUNITY): Payer: BC Managed Care – PPO

## 2013-12-06 ENCOUNTER — Inpatient Hospital Stay (HOSPITAL_COMMUNITY)
Admission: AD | Admit: 2013-12-06 | Discharge: 2013-12-06 | Disposition: A | Discharge status: Home / Self Care | Payer: BC Managed Care – PPO | Source: Ambulatory Visit | Attending: Obstetrics and Gynecology | Admitting: Obstetrics and Gynecology

## 2013-12-06 DIAGNOSIS — O034 Incomplete spontaneous abortion without complication: Secondary | ICD-10-CM | POA: Insufficient documentation

## 2013-12-06 DIAGNOSIS — O26859 Spotting complicating pregnancy, unspecified trimester: Secondary | ICD-10-CM | POA: Diagnosis present

## 2013-12-06 HISTORY — DX: Major depressive disorder, single episode, unspecified: F32.9

## 2013-12-06 HISTORY — DX: Depression, unspecified: F32.A

## 2013-12-06 LAB — CBC
HEMATOCRIT: 36.1 % (ref 36.0–46.0)
HEMOGLOBIN: 12.4 g/dL (ref 12.0–15.0)
MCH: 31.2 pg (ref 26.0–34.0)
MCHC: 34.3 g/dL (ref 30.0–36.0)
MCV: 90.9 fL (ref 78.0–100.0)
Platelets: 237 10*3/uL (ref 150–400)
RBC: 3.97 MIL/uL (ref 3.87–5.11)
RDW: 13 % (ref 11.5–15.5)
WBC: 6.3 10*3/uL (ref 4.0–10.5)

## 2013-12-06 LAB — URINE MICROSCOPIC-ADD ON

## 2013-12-06 LAB — URINALYSIS, ROUTINE W REFLEX MICROSCOPIC
Bilirubin Urine: NEGATIVE
GLUCOSE, UA: NEGATIVE mg/dL
Ketones, ur: NEGATIVE mg/dL
LEUKOCYTES UA: NEGATIVE
NITRITE: NEGATIVE
PROTEIN: NEGATIVE mg/dL
Specific Gravity, Urine: 1.015 (ref 1.005–1.030)
Urobilinogen, UA: 1 mg/dL (ref 0.0–1.0)
pH: 6 (ref 5.0–8.0)

## 2013-12-06 LAB — HCG, QUANTITATIVE, PREGNANCY: HCG, BETA CHAIN, QUANT, S: 9039 m[IU]/mL — AB (ref <5)

## 2013-12-06 LAB — POCT PREGNANCY, URINE: PREG TEST UR: POSITIVE — AB

## 2013-12-06 NOTE — MAU Note (Signed)
PT SAYS ON SAT  SHE BROWN D/C   THEN   ON SUN TURNED PINKISH BROWN  THEN BACK TO BROWN   .  THEN AT 345  TODAY    TURNED  RED.- ON PAD,  BUT WHEN SHE WIPES- MORE.  HAD CRAMPS - STARTED ON SAT .   SHE CALLED DR  Chestine Spore-  TOLD TO COME HERE  .  HAS AN APPOINTMENT ON WED-  FIRST APPOINTMENT.     LAST SEX-   3 WEEKS AGO.      WAS AT [facility]- [place]   TO CONFIRM PREG.       PAD ON IN TRIAGE-    SMALL AMT LIGHT RED.     NO MEDS FOR CRAMPS.

## 2013-12-06 NOTE — Discharge Instructions (Signed)
Miscarriage A miscarriage is the sudden loss of an unborn baby (fetus) before the 20th week of pregnancy. Most miscarriages happen in the first 3 months of pregnancy. Sometimes, it happens before a woman even knows she is pregnant. A miscarriage is also called a "spontaneous miscarriage" or "early pregnancy loss." Having a miscarriage can be an emotional experience. Talk with your caregiver about any questions you may have about miscarrying, the grieving process, and your future pregnancy plans. CAUSES   Problems with the fetal chromosomes that make it impossible for the baby to develop normally. Problems with the baby's genes or chromosomes are most often the result of errors that occur, by chance, as the embryo divides and grows. The problems are not inherited from the parents.  Infection of the cervix or uterus.   Hormone problems.   Problems with the cervix, such as having an incompetent cervix. This is when the tissue in the cervix is not strong enough to hold the pregnancy.   Problems with the uterus, such as an abnormally shaped uterus, uterine fibroids, or congenital abnormalities.   Certain medical conditions.   Smoking, drinking alcohol, or taking illegal drugs.   Trauma.  Often, the cause of a miscarriage is unknown.  SYMPTOMS   Vaginal bleeding or spotting, with or without cramps or pain.  Pain or cramping in the abdomen or lower back.  Passing fluid, tissue, or blood clots from the vagina. DIAGNOSIS  Your caregiver will perform a physical exam. You may also have an ultrasound to confirm the miscarriage. Blood or urine tests may also be ordered. TREATMENT   Sometimes, treatment is not necessary if you naturally pass all the fetal tissue that was in the uterus. If some of the fetus or placenta remains in the body (incomplete miscarriage), tissue left behind may become infected and must be removed. Usually, a dilation and curettage (D and C) procedure is performed.  During a D and C procedure, the cervix is widened (dilated) and any remaining fetal or placental tissue is gently removed from the uterus.  Antibiotic medicines are prescribed if there is an infection. Other medicines may be given to reduce the size of the uterus (contract) if there is a lot of bleeding.  If you have Rh negative blood and your baby was Rh positive, you will need a Rh immunoglobulin shot. This shot will protect any future baby from having Rh blood problems in future pregnancies. HOME CARE INSTRUCTIONS   Your caregiver may order bed rest or may allow you to continue light activity. Resume activity as directed by your caregiver.  Have someone help with home and family responsibilities during this time.   Keep track of the number of sanitary pads you use each day and how soaked (saturated) they are. Write down this information.   Do not use tampons. Do not douche or have sexual intercourse until approved by your caregiver.   Only take over-the-counter or prescription medicines for pain or discomfort as directed by your caregiver.   Do not take aspirin. Aspirin can cause bleeding.   Keep all follow-up appointments with your caregiver.   If you or your partner have problems with grieving, talk to your caregiver or seek counseling to help cope with the pregnancy loss. Allow enough time to grieve before trying to get pregnant again.  SEEK IMMEDIATE MEDICAL CARE IF:   You have severe cramps or pain in your back or abdomen.  You have a fever.  You pass large blood clots (walnut-sized   or larger) ortissue from your vagina. Save any tissue for your caregiver to inspect.   Your bleeding increases.   You have a thick, bad-smelling vaginal discharge.  You become lightheaded, weak, or you faint.   You have chills.  MAKE SURE YOU:  Understand these instructions.  Will watch your condition.  Will get help right away if you are not doing well or get  worse. Document Released: 09/18/2000 Document Revised: 07/20/2012 Document Reviewed: 05/14/2011 [employer] Patient Information 2015 [employer]. This information is not intended to replace advice given to you by your health care provider. Make sure you discuss any questions you have with your health care provider.  

## 2013-12-06 NOTE — MAU Provider Note (Signed)
History     CSN: [id number]  Arrival date and time: 12/06/13 1928   None     No chief complaint on file.  HPI Abigail Gilbert 38 y.o. [id number] at [redacted]w[redacted]d presents to MAU after 3 days of spotting that has turned to bleeding.  Initially, it was a small amt of brown blood/discharge.  Today, it became red blood, noticed with wiping and some on pad.  She has had some mild back pain vs cramping which was typical for her in other pregnancies.  She denies abdominal pain, fever, dysuria, nausea, vomiting.  She is due to see Dr. Chestine Spore on Wednesday.  She called the office Saturday with onset of symptoms and again today and was told to come here today.   OB History   Grav Para Term Preterm Abortions TAB SAB Ect Mult Living   Past Medical History  Diagnosis Date  . PONV (postoperative nausea and vomiting)     nausea after 1st CS  . Depression     Past Surgical History  Procedure Laterality Date  . Cesarean section    . Leep    . Cesarean section  07/08/2011    Procedure: CESAREAN SECTION;  Surgeon: Bing Plume, MD;  Location: [facility];  Service: Gynecology;  Laterality: N/A;    History reviewed. No pertinent family history.  History  Substance Use Topics  . Smoking status: Never Smoker   . Smokeless tobacco: Never Used  . Alcohol Use: No    Allergies:  Allergies  Allergen Reactions  . Zithromax [Azithromycin] Rash    Prescriptions prior to admission  Medication Sig Dispense Refill  . Prenatal Vit-Fe Fumarate-FA (PRENATAL MULTIVITAMIN) TABS Take 1 tablet by mouth daily.        Review of Systems  Constitutional: Negative for fever, chills and diaphoresis.  HENT: Negative for congestion and sore throat.   Eyes: Negative for blurred vision.  Respiratory: Positive for cough. Negative for shortness of breath and wheezing.   Cardiovascular: Negative for chest pain and palpitations.  Gastrointestinal: Positive for abdominal pain. Negative for heartburn, nausea  and vomiting.  Genitourinary: Negative for dysuria, frequency and hematuria.  Skin: Negative for itching and rash.  Neurological: Negative for dizziness, tingling, weakness and headaches.  Psychiatric/Behavioral: Negative for depression, suicidal ideas and substance abuse.   Physical Exam   Blood pressure 125/72, pulse 84, temperature 98.7 F (37.1 C), temperature source Oral, resp. rate 20, height  (1.575 m), weight 62.766 kg (138 lb 6 oz), last menstrual period 09/21/2013, unknown if currently breastfeeding.  Physical Exam  Constitutional: She is oriented to person, place, and time. She appears well-developed and well-nourished. No distress.  HENT:  Head: Normocephalic and atraumatic.  Eyes: EOM are normal.  Neck: Normal range of motion.  Cardiovascular: Normal rate.  Exam reveals no gallop and no friction rub.   No murmur heard. Respiratory: Effort normal and breath sounds normal. No respiratory distress.  GI: Soft. Bowel sounds are normal. She exhibits no distension. There is no tenderness.  Genitourinary:  Mod amt of dark red blood in vault but no pooling noted and no suspected products of conception  Musculoskeletal: Normal range of motion.  Neurological: She is alert and oriented to person, place, and time.  Skin: Skin is warm and dry.  Psychiatric: She has a normal mood and affect. Her behavior is normal.   Results for orders placed during the hospital  encounter of 12/06/13 (from the past 24 hour(s))  URINALYSIS, ROUTINE W REFLEX MICROSCOPIC     Status: Abnormal   Collection Time    12/06/13  7:48 PM      Result Value Ref Range   Color, Urine YELLOW  YELLOW   APPearance CLEAR  CLEAR   Specific Gravity, Urine 1.015  1.005 - 1.030   pH 6.0  5.0 - 8.0   Glucose, UA NEGATIVE  NEGATIVE mg/dL   Hgb urine dipstick LARGE (*) NEGATIVE   Bilirubin Urine NEGATIVE  NEGATIVE   Ketones, ur NEGATIVE  NEGATIVE mg/dL   Protein, ur NEGATIVE  NEGATIVE mg/dL   Urobilinogen, UA 1.0   0.0 - 1.0 mg/dL   Nitrite NEGATIVE  NEGATIVE   Leukocytes, UA NEGATIVE  NEGATIVE  URINE MICROSCOPIC-ADD ON     Status: None   Collection Time    12/06/13  7:48 PM      Result Value Ref Range   Squamous Epithelial / LPF RARE  RARE   WBC, UA 0-2  <3 WBC/hpf   RBC / HPF 11-20  <3 RBC/hpf   Bacteria, UA RARE  RARE   Urine-Other MUCOUS PRESENT    POCT PREGNANCY, URINE     Status: Abnormal   Collection Time    12/06/13  8:01 PM      Result Value Ref Range   Preg Test, Ur POSITIVE (*) NEGATIVE  CBC     Status: None   Collection Time    12/06/13  8:18 PM      Result Value Ref Range   WBC 6.3  4.0 - 10.5 K/uL   RBC 3.97  3.87 - 5.11 MIL/uL   Hemoglobin 12.4  12.0 - 15.0 g/dL   HCT 16.1  09.6 - 04.5 %   MCV 90.9  78.0 - 100.0 fL   MCH 31.2  26.0 - 34.0 pg   MCHC 34.3  30.0 - 36.0 g/dL   RDW 40.9  81.1 - 91.4 %   Platelets 237  150 - 400 K/uL  HCG, QUANTITATIVE, PREGNANCY     Status: Abnormal   Collection Time    12/06/13  8:18 PM      Result Value Ref Range   hCG, Beta Chain, Quant, S 9039 (*) <5 mIU/mL    US Ob Comp Less 14 Wks  12/06/2013   CLINICAL DATA:  Pregnant, vaginal bleeding, cramping. Gestational age by LMP 10 weeks 6 days.  EXAM: OBSTETRIC <14 WK [place] AND TRANSVAGINAL OB US  TECHNIQUE: Both transabdominal and transvaginal ultrasound examinations were performed for complete evaluation of the gestation as well as the maternal uterus, adnexal regions, and pelvic cul-de-sac. Transvaginal technique was performed to assess early pregnancy.  COMPARISON:  None.  FINDINGS: Intrauterine gestational sac: Visualized/ irregular in shape.  Yolk sac:  Not identified  Embryo:  Identified  Cardiac Activity: Not identified  Heart Rate:  0 bpm  CRL:   12  mm   7 w 3d                  [place] EDC: 07/22/2014  Maternal uterus/adnexae: Normal sonographic appearance to the ovaries. Trace free fluid.  IMPRESSION: Findings are most in keeping failed pregnancy. Intrauterine gestation however no cardiac  activity identified. Discordance in crown-rump length and expected size by LMP.   Electronically Signed   By: Jearld Lesch M.D.   On: 12/06/2013 21:26   US Ob Transvaginal  12/06/2013   CLINICAL DATA:  Pregnant, vaginal bleeding,  cramping. Gestational age by LMP 10 weeks 6 days.  EXAM: OBSTETRIC <14 WK [place] AND TRANSVAGINAL OB US  TECHNIQUE: Both transabdominal and transvaginal ultrasound examinations were performed for complete evaluation of the gestation as well as the maternal uterus, adnexal regions, and pelvic cul-de-sac. Transvaginal technique was performed to assess early pregnancy.  COMPARISON:  None.  FINDINGS: Intrauterine gestational sac: Visualized/ irregular in shape.  Yolk sac:  Not identified  Embryo:  Identified  Cardiac Activity: Not identified  Heart Rate:  0 bpm  CRL:   12  mm   7 w 3d                  [place] EDC: 07/22/2014  Maternal uterus/adnexae: Normal sonographic appearance to the ovaries. Trace free fluid.  IMPRESSION: Findings are most in keeping failed pregnancy. Intrauterine gestation however no cardiac activity identified. Discordance in crown-rump length and expected size by LMP.   Electronically Signed   By: Jearld Lesch M.D.   On: 12/06/2013 21:26    MAU Course  Procedures Labs and U/S  MDM Discussed with Dr. Claiborne Billings.  U/S suggests failed preg: 10 wks by LMP, CRL of 7 weeks and no cardiac activity.  Quant of 9000.    Assessment and Plan  A: Incomplete Abortion  P: Discharge to home Follow up with Dr. Chestine Spore on Wednesday as previously scheduled Expectations for miscarriage explained to include heavy bleeding and cramping Okay to use Ibuprofen prn Return to MAU as needed   Bertram Denver 12/06/2013, 8:33 PM

## 2013-12-08 ENCOUNTER — Encounter (HOSPITAL_COMMUNITY): Payer: Self-pay

## 2013-12-08 ENCOUNTER — Inpatient Hospital Stay (HOSPITAL_COMMUNITY)
Admission: AD | Admit: 2013-12-08 | Discharge: 2013-12-08 | Disposition: A | Discharge status: Home / Self Care | Payer: BC Managed Care – PPO | Source: Ambulatory Visit | Attending: Obstetrics and Gynecology | Admitting: Obstetrics and Gynecology

## 2013-12-08 DIAGNOSIS — R109 Unspecified abdominal pain: Secondary | ICD-10-CM | POA: Insufficient documentation

## 2013-12-08 DIAGNOSIS — O034 Incomplete spontaneous abortion without complication: Secondary | ICD-10-CM | POA: Insufficient documentation

## 2013-12-08 DIAGNOSIS — O039 Complete or unspecified spontaneous abortion without complication: Secondary | ICD-10-CM | POA: Diagnosis not present

## 2013-12-08 LAB — CBC WITH DIFFERENTIAL/PLATELET
BASOS ABS: 0 10*3/uL (ref 0.0–0.1)
BASOS PCT: 0 % (ref 0–1)
Eosinophils Absolute: 0 10*3/uL (ref 0.0–0.7)
Eosinophils Relative: 0 % (ref 0–5)
HCT: 35.6 % — ABNORMAL LOW (ref 36.0–46.0)
Hemoglobin: 12.3 g/dL (ref 12.0–15.0)
Lymphocytes Relative: 9 % — ABNORMAL LOW (ref 12–46)
Lymphs Abs: 0.9 10*3/uL (ref 0.7–4.0)
MCH: 31.4 pg (ref 26.0–34.0)
MCHC: 34.6 g/dL (ref 30.0–36.0)
MCV: 90.8 fL (ref 78.0–100.0)
Monocytes Absolute: 0.4 10*3/uL (ref 0.1–1.0)
Monocytes Relative: 4 % (ref 3–12)
NEUTROS ABS: 8.6 10*3/uL — AB (ref 1.7–7.7)
Neutrophils Relative %: 87 % — ABNORMAL HIGH (ref 43–77)
Platelets: 228 10*3/uL (ref 150–400)
RBC: 3.92 MIL/uL (ref 3.87–5.11)
RDW: 13 % (ref 11.5–15.5)
WBC: 9.9 10*3/uL (ref 4.0–10.5)

## 2013-12-08 LAB — HCG, QUANTITATIVE, PREGNANCY: hCG, Beta Chain, Quant, S: 5661 m[IU]/mL — ABNORMAL HIGH (ref <5)

## 2013-12-08 MED ORDER — OXYCODONE-ACETAMINOPHEN 5-325 MG PO TABS
1.0000 | ORAL_TABLET | Freq: Once | ORAL | Status: AC
Start: 2013-12-08 — End: 2013-12-08
  Administered 2013-12-08: 1 via ORAL
  Filled 2013-12-08: qty 1

## 2013-12-08 MED ORDER — OXYCODONE-ACETAMINOPHEN 5-325 MG PO TABS: 1.0000 | ORAL_TABLET | ORAL | Status: AC | PRN

## 2013-12-08 NOTE — MAU Note (Signed)
Pt. miscarried Monday night, was seen in MAU with "excruciating pain and clots." Felt better Tuesday, but is now experiencing much more intense pain and variably heavy bleeding (soaked a pad within 30 minutes).

## 2013-12-08 NOTE — MAU Note (Signed)
Pt reports she was here on Monday with a miscarriage, today had increased pain, nausea, and chills. Began bleeding heavier when the pain increased.

## 2013-12-08 NOTE — Discharge Instructions (Signed)
Incomplete Miscarriage A miscarriage is the sudden loss of an unborn baby (fetus) before the 20th week of pregnancy. In an incomplete miscarriage, parts of the fetus or placenta (afterbirth) remain in the body.  Having a miscarriage can be an emotional experience. Talk with your health care provider about any questions you may have about miscarrying, the grieving process, and your future pregnancy plans. CAUSES   Problems with the fetal chromosomes that make it impossible for the baby to develop normally. Problems with the baby's genes or chromosomes are most often the result of errors that occur by chance as the embryo divides and grows. The problems are not inherited from the parents.  Infection of the cervix or uterus.  Hormone problems.  Problems with the cervix, such as having an incompetent cervix. This is when the tissue in the cervix is not strong enough to hold the pregnancy.  Problems with the uterus, such as an abnormally shaped uterus, uterine fibroids, or congenital abnormalities.  Certain medical conditions.  Smoking, drinking alcohol, or taking illegal drugs.  Trauma. SYMPTOMS   Vaginal bleeding or spotting, with or without cramps or pain.  Pain or cramping in the abdomen or lower back.  Passing fluid, tissue, or blood clots from the vagina. DIAGNOSIS  Your health care provider will perform a physical exam. You may also have an ultrasound to confirm the miscarriage. Blood or urine tests may also be ordered. TREATMENT   Usually, a dilation and curettage (D&C) procedure is performed. During a D&C procedure, the cervix is widened (dilated) and any remaining fetal or placental tissue is gently removed from the uterus.  Antibiotic medicines are prescribed if there is an infection. Other medicines may be given to reduce the size of the uterus (contract) if there is a lot of bleeding.  If you have Rh negative blood and your baby was Rh positive, you will need a Rho (D)  immune globulin shot. This shot will protect any future baby from having Rh blood problems in future pregnancies.  You may be confined to bed rest. This means you should stay in bed and only get up to use the bathroom. HOME CARE INSTRUCTIONS   Rest as directed by your health care provider.  Restrict activity as directed by your health care provider. You may be allowed to continue light activity if curettage was not done but you require further treatment.  Keep track of the number of pads you use each day. Keep track of how soaked (saturated) they are. Record this information.  Do not  use tampons.  Do not douche or have sexual intercourse until approved by your health care provider.  Keep all follow-up appointments for reevaluation and continuing management.  Only take over-the-counter or prescription medicines for pain, fever, or discomfort as directed by your health care provider.  Take antibiotic medicine as directed by your health care provider. Make sure you finish it even if you start to feel better. SEEK IMMEDIATE MEDICAL CARE IF:   You experience severe cramps in your stomach, back, or abdomen.  You have an unexplained temperature (make sure to record these temperatures).  You pass large clots or tissue (save these for your health care provider to inspect).  Your bleeding increases.  You become light-headed, weak, or have fainting episodes. MAKE SURE YOU:   Understand these instructions.  Will watch your condition.  Will get help right away if you are not doing well or get worse. Document Released: 03/25/2005 Document Revised: 08/09/2013 Document Reviewed:   10/22/2012 Exit[employer] Patient Information 2015 [employer]. This information is not intended to replace advice given to you by your health care provider. Make sure you discuss any questions you have with your health care provider.  

## 2013-12-08 NOTE — MAU Provider Note (Signed)
History     CSN: [id number]  Arrival date and time: 12/08/13 1845   First Provider Initiated Contact with Patient 12/08/13 2014      No chief complaint on file.  HPI Ms. Deloris Moger is a 38 y.o. G3P2002 at [redacted]w[redacted]d who presents to MAU today with complaint of increased lower abdominal pain and vaginal bleeding. She was diagnosed with missed AB on Monday. She states that this afternoon she soaked a pad in ~ 30 minutes on her way home. She is passing clots. She endorses feeling weak and lightheaded earlier today. She also had N/V upon arrival in MAU. She rates her pain at 7/10 now. Last Aleve was taken at 1500 today without relief. She was seen in the office this afternoon prior to onset of heavy bleeding. She states US scheduled in 2 weeks at office follow-up appointment. Quant hCG on 12/06/13 was 9039.   OB History   Grav Para Term Preterm Abortions TAB SAB Ect Mult Living   Past Medical History  Diagnosis Date  . PONV (postoperative nausea and vomiting)     nausea after 1st CS  . Depression     Past Surgical History  Procedure Laterality Date  . Cesarean section    . Leep    . Cesarean section  07/08/2011    Procedure: CESAREAN SECTION;  Surgeon: Bing Plume, MD;  Location: [facility];  Service: Gynecology;  Laterality: N/A;    History reviewed. No pertinent family history.  History  Substance Use Topics  . Smoking status: Never Smoker   . Smokeless tobacco: Never Used  . Alcohol Use: No    Allergies:  Allergies  Allergen Reactions  . Zithromax [Azithromycin] Rash    Prescriptions prior to admission  Medication Sig Dispense Refill  . Prenatal Vit-Fe Fumarate-FA (PRENATAL MULTIVITAMIN) TABS Take 1 tablet by mouth daily.        Review of Systems  Constitutional: Negative for fever and malaise/fatigue.  Gastrointestinal: Positive for nausea, vomiting and abdominal pain.  Genitourinary:       + vaginal bleeding  Neurological: Positive for  dizziness and weakness. Negative for loss of consciousness.   Physical Exam   Blood pressure 121/76, pulse 83, temperature 98 F (36.7 C), temperature source Oral, resp. rate 20, last menstrual period 09/21/2013, SpO2 100.00%, unknown if currently breastfeeding.  Physical Exam  Constitutional: She is oriented to person, place, and time. She appears well-developed and well-nourished. No distress.  HENT:  Head: Normocephalic.  Cardiovascular: Normal rate.   Respiratory: Effort normal.  GI: Soft. She exhibits no distension and no mass. There is tenderness (mild tenderness to palpation of the pelvic area). There is no rebound and no guarding.  Neurological: She is alert and oriented to person, place, and time.  Skin: Skin is warm and dry. No erythema.  Psychiatric: She has a normal mood and affect.   Results for orders placed during the hospital encounter of 12/08/13 (from the past 24 hour(s))  CBC WITH DIFFERENTIAL     Status: Abnormal   Collection Time    12/08/13  9:00 PM      Result Value Ref Range   WBC 9.9  4.0 - 10.5 K/uL   RBC 3.92  3.87 - 5.11 MIL/uL   Hemoglobin 12.3  12.0 - 15.0 g/dL   HCT 40.9 (*) 81.1 - 91.4 %   MCV 90.8  78.0 - 100.0 fL  MCH 31.4  26.0 - 34.0 pg   MCHC 34.6  30.0 - 36.0 g/dL   RDW 16.1  09.6 - 04.5 %   Platelets 228  150 - 400 K/uL   Neutrophils Relative % 87 (*) 43 - 77 %   Neutro Abs 8.6 (*) 1.7 - 7.7 K/uL   Lymphocytes Relative 9 (*) 12 - 46 %   Lymphs Abs 0.9  0.7 - 4.0 K/uL   Monocytes Relative 4  3 - 12 %   Monocytes Absolute 0.4  0.1 - 1.0 K/uL   Eosinophils Relative 0  0 - 5 %   Eosinophils Absolute 0.0  0.0 - 0.7 K/uL   Basophils Relative 0  0 - 1 %   Basophils Absolute 0.0  0.0 - 0.1 K/uL  HCG, QUANTITATIVE, PREGNANCY     Status: Abnormal   Collection Time    12/08/13  9:00 PM      Result Value Ref Range   hCG, Beta Chain, Quant, S 5661 (*) <5 mIU/mL    MAU Course  Procedures None  MDM Discussed with Dr. Tenny Craw. If patient is  stable. Discharge with Rx for Percocet. Follow-up in the office as scheduled or sooner PRN  Assessment and Plan  A: Incomplete AB  P: Discharge home Rx for Percocet given to patient Bleeding precautions discussed Patient advised to follow-up in the office as scheduled or sooner if symptoms worsen Patient may return to MAU as needed or if her condition were to change or worsen  Marny Lowenstein, PA-C  12/08/2013, 9:43 PM

## 2014-02-07 ENCOUNTER — Encounter (HOSPITAL_COMMUNITY): Payer: Self-pay

## 2014-10-11 ENCOUNTER — Encounter (HOSPITAL_COMMUNITY): Payer: Self-pay | Admitting: *Deleted

## 2015-08-26 IMAGING — US US OB COMP LESS 14 WK
1 series · 14 of 28 positions shown · non-contrast
Comparison: None.

CLINICAL DATA: Pregnant, vaginal bleeding, cramping. Gestational
age by LMP 10 weeks 6 days.

EXAM:
OBSTETRIC <14 WK US AND TRANSVAGINAL OB US
TECHNIQUE: Both transabdominal and transvaginal ultrasound examinations were
performed for complete evaluation of the gestation as well as the
maternal uterus, adnexal regions, and pelvic cul-de-sac.
Transvaginal technique was performed to assess early pregnancy.

[Series 1: us ob comp less 14 wks · 14 of 41 slices shown]
[im 2/41]
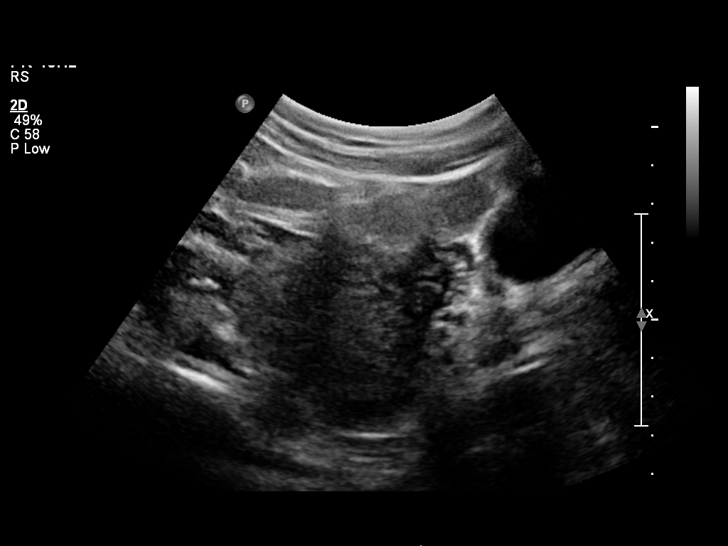
[im 5/41]
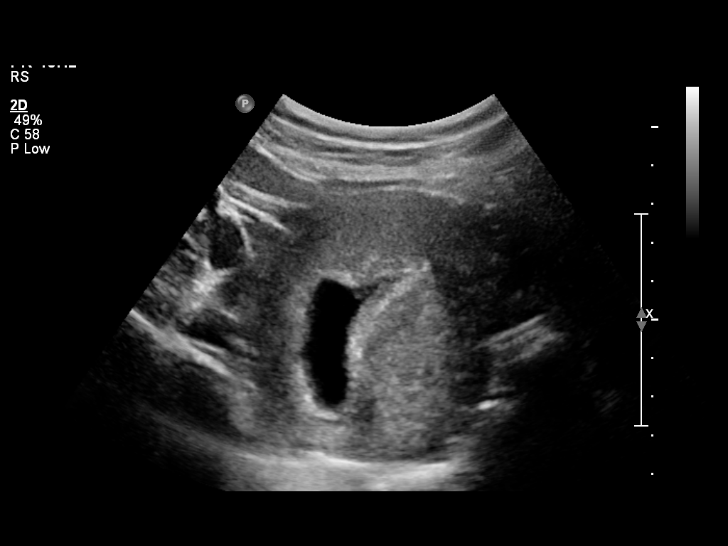
[im 8/41]
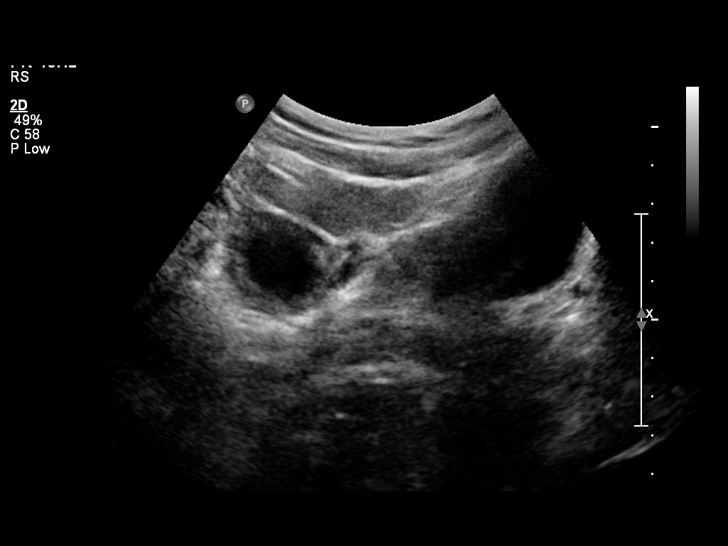
[im 11/41]
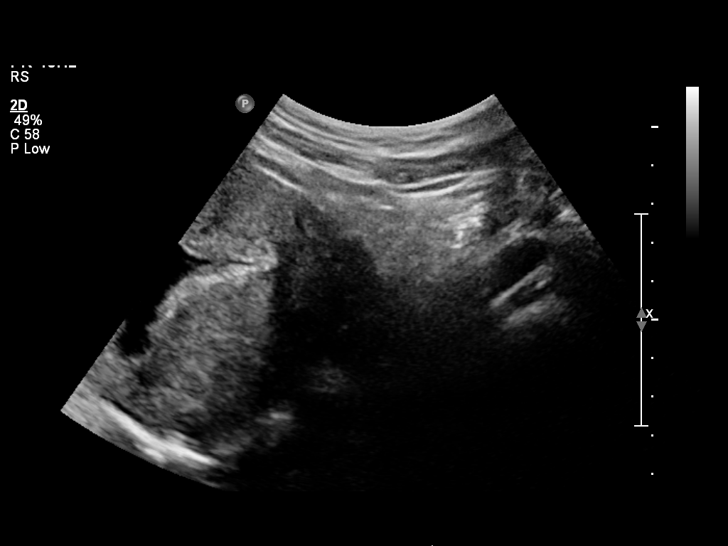
[im 14/41]
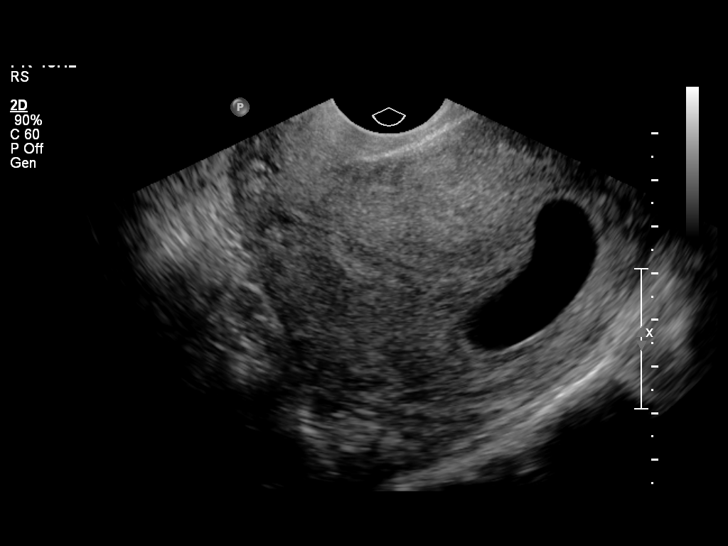
[im 17/41]
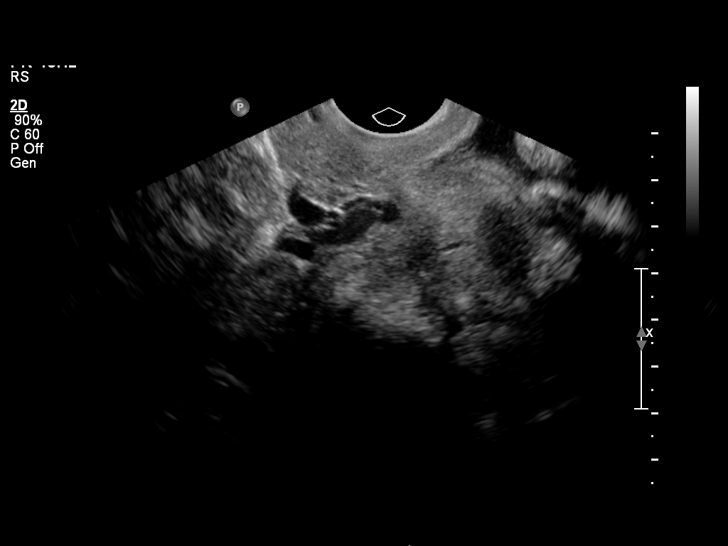
[im 20/41]
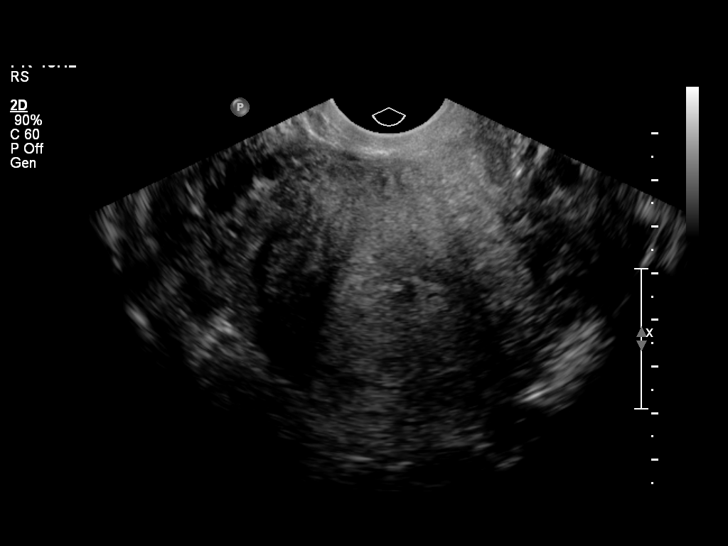
[im 23/41]
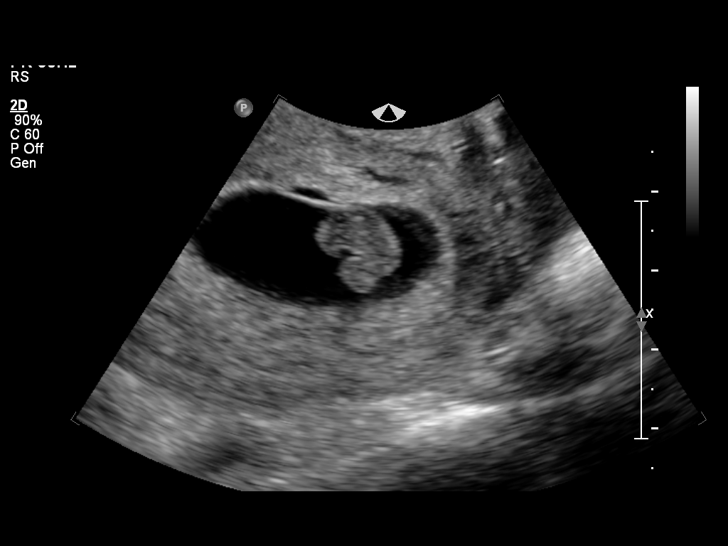
[im 26/41]
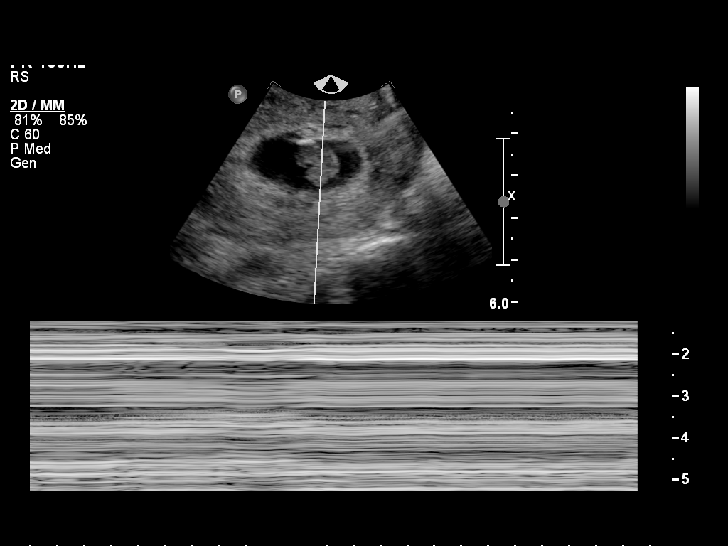
[im 29/41]
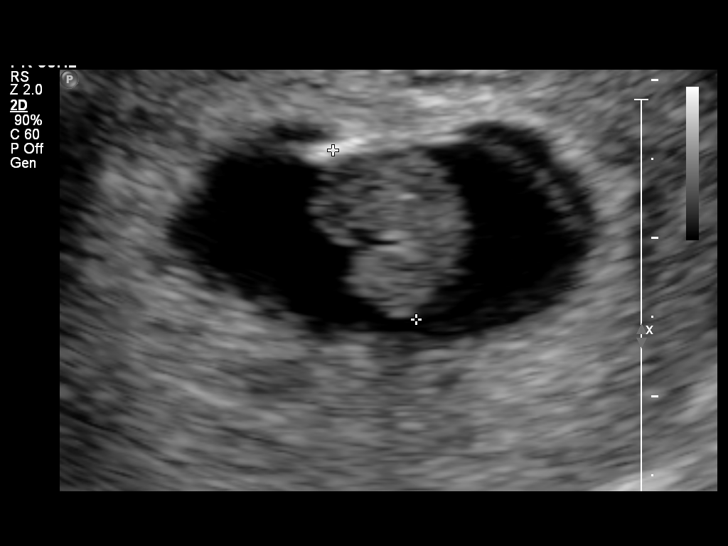
[im 32/41]
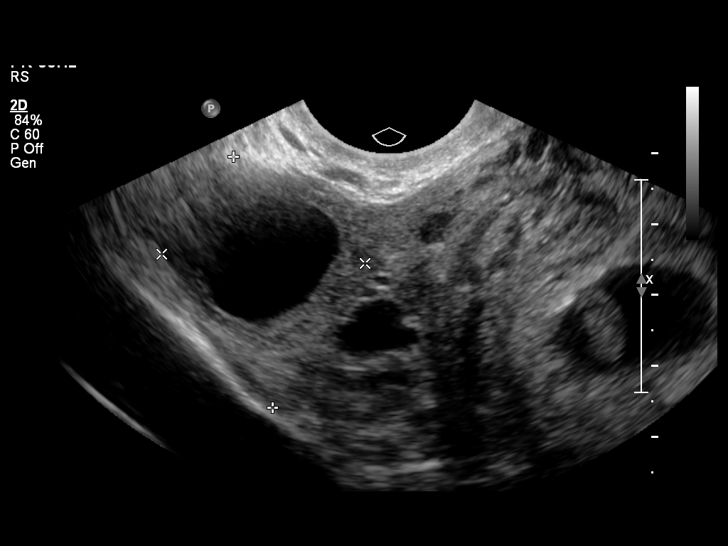
[im 35/41]
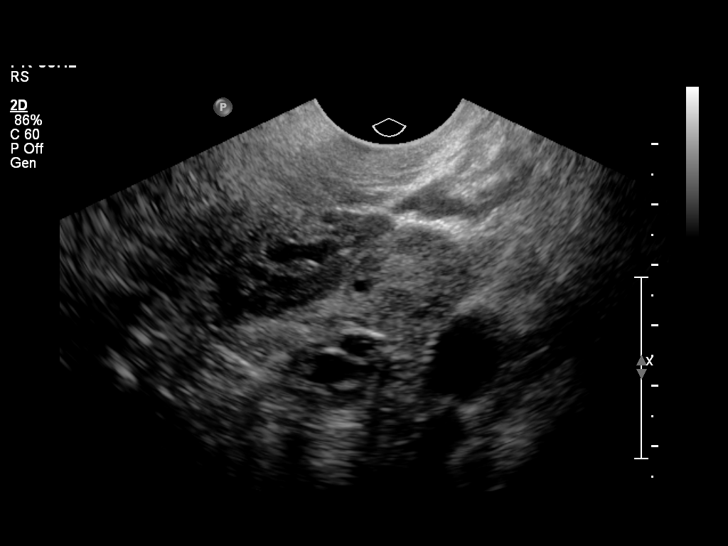
[im 38/41]
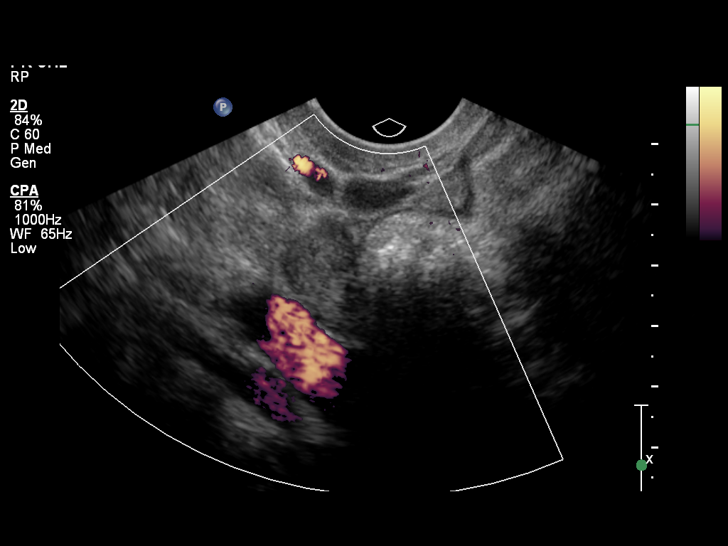
[im 41/41]
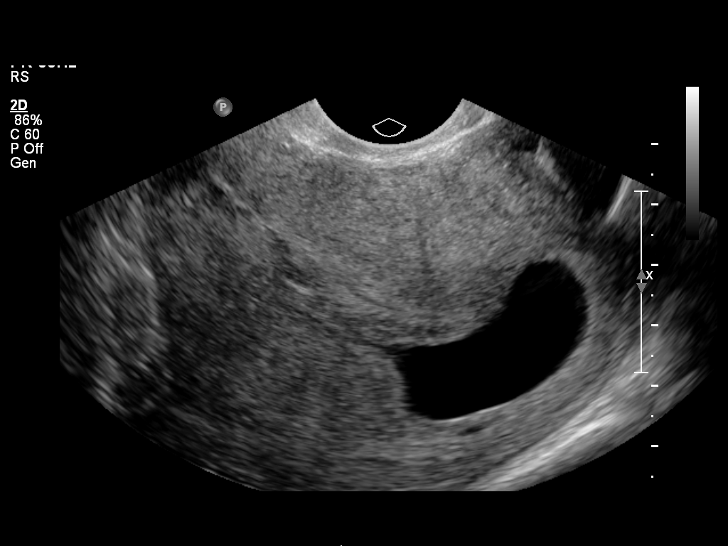

[14 of 28 positions shown; findings below may reference images not displayed]

FINDINGS: Intrauterine gestational sac: Visualized/ irregular in shape.

Yolk sac:  Not identified

Embryo:  Identified

Cardiac Activity: Not identified

Heart Rate:  0 bpm

CRL:   12  mm   7 w 3d                  US EDC: 07/22/2014

Maternal uterus/adnexae: Normal sonographic appearance to the
ovaries. Trace free fluid.
IMPRESSION: Findings are most in keeping failed pregnancy. Intrauterine
gestation however no cardiac activity identified. Discordance in
crown-rump length and expected size by LMP.
